# Patient Record
Sex: Female | Born: 1960 | Race: White | Hispanic: No | Marital: Married | Smoking: Current every day smoker
Health system: Southern US, Community
[De-identification: ages and names within clinical notes are randomized; demographics above are authoritative.]

## PROBLEM LIST (undated history)

## (undated) DIAGNOSIS — I10 Essential (primary) hypertension: Secondary | ICD-10-CM

## (undated) HISTORY — PX: TUMOR REMOVAL: SHX12

---

## 2007-06-14 ENCOUNTER — Ambulatory Visit: Payer: Self-pay | Admitting: Gastroenterology

## 2007-07-09 ENCOUNTER — Ambulatory Visit: Payer: Self-pay | Admitting: Gastroenterology

## 2007-07-31 ENCOUNTER — Emergency Department: Payer: Self-pay | Admitting: Emergency Medicine

## 2007-08-15 ENCOUNTER — Ambulatory Visit: Payer: Self-pay | Admitting: Gastroenterology

## 2007-08-19 ENCOUNTER — Ambulatory Visit: Payer: Self-pay | Admitting: Gastroenterology

## 2007-08-23 ENCOUNTER — Other Ambulatory Visit: Payer: Self-pay

## 2007-08-23 ENCOUNTER — Emergency Department: Payer: Self-pay | Admitting: Emergency Medicine

## 2007-11-12 ENCOUNTER — Ambulatory Visit: Payer: Self-pay | Admitting: Gastroenterology

## 2008-09-22 ENCOUNTER — Ambulatory Visit: Payer: Self-pay | Admitting: Obstetrics and Gynecology

## 2008-10-07 ENCOUNTER — Ambulatory Visit: Payer: Self-pay | Admitting: Obstetrics and Gynecology

## 2015-09-20 ENCOUNTER — Other Ambulatory Visit: Payer: Self-pay | Admitting: Gastroenterology

## 2015-09-20 DIAGNOSIS — R1084 Generalized abdominal pain: Secondary | ICD-10-CM

## 2015-09-23 ENCOUNTER — Ambulatory Visit
Admission: RE | Admit: 2015-09-23 | Discharge: 2015-09-23 | Disposition: A | Discharge status: Home / Self Care | Payer: 59 | Source: Ambulatory Visit | Attending: Gastroenterology | Admitting: Gastroenterology

## 2015-09-23 DIAGNOSIS — R1084 Generalized abdominal pain: Secondary | ICD-10-CM | POA: Insufficient documentation

## 2015-09-23 DIAGNOSIS — K760 Fatty (change of) liver, not elsewhere classified: Secondary | ICD-10-CM | POA: Diagnosis not present

## 2015-09-23 DIAGNOSIS — I700 Atherosclerosis of aorta: Secondary | ICD-10-CM | POA: Diagnosis not present

## 2015-09-23 HISTORY — DX: Essential (primary) hypertension: I10

## 2015-09-23 MED ORDER — IOHEXOL 300 MG/ML  SOLN
100.0000 mL | Freq: Once | INTRAMUSCULAR | Status: AC | PRN
  Administered 2015-09-23: 100 mL via INTRAVENOUS

## 2015-09-24 ENCOUNTER — Other Ambulatory Visit: Payer: Self-pay | Admitting: Gastroenterology

## 2015-09-24 DIAGNOSIS — R1011 Right upper quadrant pain: Secondary | ICD-10-CM

## 2015-09-27 ENCOUNTER — Telehealth (HOSPITAL_COMMUNITY): Payer: Self-pay | Admitting: Gastroenterology

## 2015-10-05 ENCOUNTER — Encounter
Admission: RE | Admit: 2015-10-05 | Discharge: 2015-10-05 | Disposition: A | Discharge status: Home / Self Care | Payer: 59 | Source: Ambulatory Visit | Attending: Gastroenterology | Admitting: Gastroenterology

## 2015-10-05 DIAGNOSIS — R1011 Right upper quadrant pain: Secondary | ICD-10-CM | POA: Insufficient documentation

## 2015-10-05 DIAGNOSIS — R110 Nausea: Secondary | ICD-10-CM | POA: Insufficient documentation

## 2015-10-05 MED ORDER — TECHNETIUM TC 99M MEBROFENIN IV KIT
5.0000 | PACK | Freq: Once | INTRAVENOUS | Status: AC | PRN
  Administered 2015-10-05: 5.15 via INTRAVENOUS

## 2015-10-05 MED ORDER — SINCALIDE 5 MCG IJ SOLR
0.0200 ug/kg | Freq: Once | INTRAMUSCULAR | Status: AC
  Administered 2015-10-05: 1.37 ug via INTRAVENOUS

## 2016-09-20 IMAGING — NM NM HEPATO W/GB/PHARM/[PERSON_NAME]
3 series · 18 of 18 positions shown · non-contrast
Comparison: None.

CLINICAL DATA: Right upper quadrant abdominal pain and nausea.

EXAM:
NUCLEAR MEDICINE HEPATOBILIARY IMAGING WITH GALLBLADDER EF
TECHNIQUE: Sequential images of the abdomen were obtained [DATE] minutes
following intravenous administration of radiopharmaceutical. After
slow intravenous infusion of 1.4 micrograms Cholecystokinin,
gallbladder ejection fraction was determined.
RADIOPHARMACEUTICALS:  5.2 mCi Qc-FFm Choletec IV

[Series 1000: hepatobiliary dynamic · 9.59mm/px · 6 of 60 frames shown]
[frame 6/60]
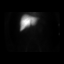
[frame 16/60]
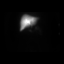
[frame 26/60]
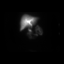
[frame 36/60]
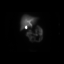
[frame 46/60]
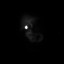
[frame 56/60]
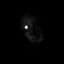

[Series 1000: gallbladder ef dynamic (results) · 4.80mm/px · 6 of 120 frames shown]
[frame 11/120]
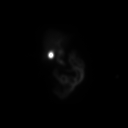
[frame 31/120]
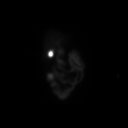
[frame 51/120]
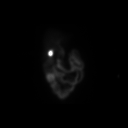
[frame 71/120]
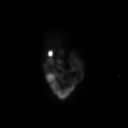
[frame 91/120]
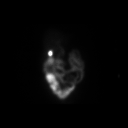
[frame 111/120]
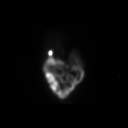

[Series 1000: gallbladder ef dynamic · 4.80mm/px · 6 of 120 frames shown]
[frame 11/120]
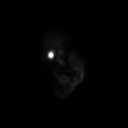
[frame 31/120]
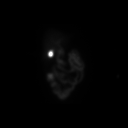
[frame 51/120]
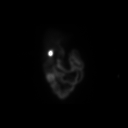
[frame 71/120]
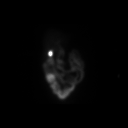
[frame 91/120]
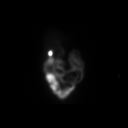
[frame 111/120]
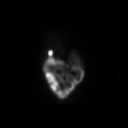

[18 of 18 positions shown; findings below may reference images not displayed]

FINDINGS: Radiopharmaceutical uptake by liver and biliary excretion of
activity demonstrated. Liver is unremarkable in appearance.

Gallbladder filling is demonstrated, consistent with patent cystic
duct. Biliary activity also seen entering small bowel, consistent
with patent common bile duct.

Calculated gallbladder ejection fraction is 72%. At 45 min, normal
ejection fraction is greater than 40%.
IMPRESSION: Normal study. Patency of cystic and common bile ducts demonstrated.

Normal gallbladder ejection fraction of 72% .

## 2018-09-18 ENCOUNTER — Emergency Department (HOSPITAL_COMMUNITY): Admit: 2018-09-18 | Discharge: 2018-09-18 | Discharge status: Absent without leave | Payer: 59

## 2022-01-12 ENCOUNTER — Other Ambulatory Visit: Payer: Self-pay

## 2022-01-12 ENCOUNTER — Other Ambulatory Visit
Admission: RE | Admit: 2022-01-12 | Discharge: 2022-01-12 | Disposition: A | Discharge status: Home / Self Care | Payer: 59 | Source: Home / Self Care | Attending: Ophthalmology | Admitting: Ophthalmology

## 2022-01-12 ENCOUNTER — Other Ambulatory Visit: Payer: Self-pay | Admitting: Ophthalmology

## 2022-01-12 ENCOUNTER — Ambulatory Visit
Admission: RE | Admit: 2022-01-12 | Discharge: 2022-01-12 | Disposition: A | Discharge status: Home / Self Care | Payer: 59 | Source: Ambulatory Visit | Attending: Ophthalmology | Admitting: Ophthalmology

## 2022-01-12 DIAGNOSIS — H05119 Granuloma of unspecified orbit: Secondary | ICD-10-CM | POA: Insufficient documentation

## 2022-01-12 LAB — CBC WITH DIFFERENTIAL/PLATELET
Abs Immature Granulocytes: 0.03 10*3/uL (ref 0.00–0.07)
Basophils Absolute: 0.1 10*3/uL (ref 0.0–0.1)
Basophils Relative: 1 %
Eosinophils Absolute: 0.3 10*3/uL (ref 0.0–0.5)
Eosinophils Relative: 3 %
HCT: 44.4 % (ref 36.0–46.0)
Hemoglobin: 14.7 g/dL (ref 12.0–15.0)
Immature Granulocytes: 0 %
Lymphocytes Relative: 28 %
Lymphs Abs: 2.8 10*3/uL (ref 0.7–4.0)
MCH: 28.5 pg (ref 26.0–34.0)
MCHC: 33.1 g/dL (ref 30.0–36.0)
MCV: 86 fL (ref 80.0–100.0)
Monocytes Absolute: 0.7 10*3/uL (ref 0.1–1.0)
Monocytes Relative: 7 %
Neutro Abs: 6.3 10*3/uL (ref 1.7–7.7)
Neutrophils Relative %: 61 %
Platelets: 246 10*3/uL (ref 150–400)
RBC: 5.16 MIL/uL — ABNORMAL HIGH (ref 3.87–5.11)
RDW: 13.4 % (ref 11.5–15.5)
WBC: 10 10*3/uL (ref 4.0–10.5)
nRBC: 0 % (ref 0.0–0.2)

## 2022-01-12 LAB — RENAL FUNCTION PANEL
Albumin: 4.3 g/dL (ref 3.5–5.0)
Anion gap: 10 (ref 5–15)
BUN: 18 mg/dL (ref 8–23)
CO2: 28 mmol/L (ref 22–32)
Calcium: 10.1 mg/dL (ref 8.9–10.3)
Chloride: 99 mmol/L (ref 98–111)
Creatinine, Ser: 1.35 mg/dL — ABNORMAL HIGH (ref 0.44–1.00)
GFR, Estimated: 44 mL/min — ABNORMAL LOW (ref >60)
Glucose, Bld: 105 mg/dL — ABNORMAL HIGH (ref 70–99)
Phosphorus: 3.2 mg/dL (ref 2.5–4.6)
Potassium: 4 mmol/L (ref 3.5–5.1)
Sodium: 137 mmol/L (ref 135–145)

## 2022-01-12 LAB — C-REACTIVE PROTEIN: CRP: 2.7 mg/dL — ABNORMAL HIGH (ref <1.0)

## 2022-01-12 LAB — GLUCOSE, RANDOM: Glucose, Bld: 101 mg/dL — ABNORMAL HIGH (ref 70–99)

## 2022-01-12 LAB — LACTATE DEHYDROGENASE: LDH: 159 U/L (ref 98–192)

## 2022-01-12 LAB — SEDIMENTATION RATE: Sed Rate: 17 mm/hr (ref 0–30)

## 2022-01-12 LAB — POCT I-STAT CREATININE: Creatinine, Ser: 1.4 mg/dL — ABNORMAL HIGH (ref 0.44–1.00)

## 2022-01-12 MED ORDER — IOHEXOL 300 MG/ML  SOLN
75.0000 mL | Freq: Once | INTRAMUSCULAR | Status: AC | PRN
  Administered 2022-01-12: 14:00:00 60 mL via INTRAVENOUS

## 2022-01-13 LAB — IGG 1, 2, 3, AND 4
IgG (Immunoglobin G), Serum: 866 mg/dL (ref 586–1602)
IgG, Subclass 1: 447 mg/dL (ref 248–810)
IgG, Subclass 2: 279 mg/dL (ref 130–555)
IgG, Subclass 3: 62 mg/dL (ref 15–102)
IgG, Subclass 4: 45 mg/dL (ref 2–96)

## 2022-01-13 LAB — HEMOGLOBIN A1C
Hgb A1c MFr Bld: 5.7 % — ABNORMAL HIGH (ref 4.8–5.6)
Mean Plasma Glucose: 117 mg/dL

## 2022-01-13 LAB — ANGIOTENSIN CONVERTING ENZYME: Angiotensin-Converting Enzyme: 56 U/L (ref 14–82)

## 2022-01-13 LAB — ANA: Anti Nuclear Antibody (ANA): NEGATIVE

## 2022-01-16 LAB — PROTEIN ELECTROPHORESIS, SERUM
A/G Ratio: 1.2 (ref 0.7–1.7)
Albumin ELP: 3.8 g/dL (ref 2.9–4.4)
Alpha-1-Globulin: 0.3 g/dL (ref 0.0–0.4)
Alpha-2-Globulin: 0.9 g/dL (ref 0.4–1.0)
Beta Globulin: 1 g/dL (ref 0.7–1.3)
Gamma Globulin: 0.9 g/dL (ref 0.4–1.8)
Globulin, Total: 3.1 g/dL (ref 2.2–3.9)
Total Protein ELP: 6.9 g/dL (ref 6.0–8.5)

## 2022-01-16 LAB — ANCA TITERS
Atypical P-ANCA titer: <1:20 {titer}
C-ANCA: <1:20 {titer}
P-ANCA: <1:20 {titer}

## 2022-01-16 LAB — LYSOZYME, SERUM: Lysozyme: 11.1 ug/mL — ABNORMAL HIGH (ref 3.6–8.1)

## 2022-05-18 ENCOUNTER — Ambulatory Visit
Admission: EM | Admit: 2022-05-18 | Discharge: 2022-05-18 | Disposition: A | Discharge status: Home / Self Care | Payer: 59 | Attending: Emergency Medicine | Admitting: Emergency Medicine

## 2022-05-18 DIAGNOSIS — R519 Headache, unspecified: Secondary | ICD-10-CM

## 2022-05-18 DIAGNOSIS — B3731 Acute candidiasis of vulva and vagina: Secondary | ICD-10-CM | POA: Insufficient documentation

## 2022-05-18 LAB — URINALYSIS, ROUTINE W REFLEX MICROSCOPIC
Bilirubin Urine: NEGATIVE
Glucose, UA: NEGATIVE mg/dL
Ketones, ur: NEGATIVE mg/dL
Nitrite: NEGATIVE
Protein, ur: NEGATIVE mg/dL
Specific Gravity, Urine: <1.005 — ABNORMAL LOW (ref 1.005–1.030)
pH: 5.5 (ref 5.0–8.0)

## 2022-05-18 LAB — URINALYSIS, MICROSCOPIC (REFLEX)

## 2022-05-18 LAB — WET PREP, GENITAL
Clue Cells Wet Prep HPF POC: NONE SEEN
Sperm: NONE SEEN
Trich, Wet Prep: NONE SEEN
WBC, Wet Prep HPF POC: >10 — AB (ref <10)

## 2022-05-18 MED ORDER — IBUPROFEN 800 MG PO TABS: 800.0000 mg | ORAL_TABLET | Freq: Three times a day (TID) | ORAL | 0 refills | Status: AC

## 2022-05-18 MED ORDER — DEXAMETHASONE SODIUM PHOSPHATE 10 MG/ML IJ SOLN
10.0000 mg | Freq: Once | INTRAMUSCULAR | Status: AC
  Administered 2022-05-18: 10 mg via INTRAMUSCULAR

## 2022-05-18 MED ORDER — FLUCONAZOLE 150 MG PO TABS: 150.0000 mg | ORAL_TABLET | Freq: Every day | ORAL | 0 refills | Status: AC

## 2022-05-18 MED ORDER — ONDANSETRON 4 MG PO TBDP
4.0000 mg | ORAL_TABLET | Freq: Three times a day (TID) | ORAL | 0 refills | Status: AC | PRN
Start: 2022-05-18 — End: ?

## 2022-05-18 MED ORDER — KETOROLAC TROMETHAMINE 60 MG/2ML IM SOLN
30.0000 mg | Freq: Once | INTRAMUSCULAR | Status: AC
  Administered 2022-05-18: 30 mg via INTRAMUSCULAR

## 2022-05-18 NOTE — ED Provider Notes (Signed)
With distal [facility]    CSN: [id number] Arrival date & time: 05/18/22  1640      History   Chief Complaint Chief Complaint  Patient presents with   Headache   Nausea    HPI Felicia Austin is a 61 y.o. female.   Patient presents with centralized lower abdominal pain with  Nausea without vomiting for 1 day.  Pain does not radiate, described as a 10 out of 10, pain is constant and described as a restlessness . endorses that she had 3 soft but not watery stools this morning.  Decreased appetite but tolerating fluids.  No known sick contacts.  Denies fever, chills, body aches, upper respiratory symptoms, urinary or vaginal symptoms.  Endorses recent history of reoccurring urinary infection, last occurrence 2 to 3 weeks ago.    Patient presents with a frontal headache occurring constantly for the last 5 to 6 hours.  Headaches have been reoccurring for the last 3 to 4 days.  Associated photophobia, phonophobia and nausea worsen once headache was present.  Feels this may be related to her sinuses but denies nasal congestion.  Endorses ear pain beginning 1 day ago bilaterally without drainage, itching.  Has attempted use of over-the-counter analgesics which have been ineffective.  Denies visual changes, dizziness, lightheadedness, weakness, memory speech changes.   Past Medical History:  Diagnosis Date   Hypertension     There are no problems to display for this patient.   Past Surgical History:  Procedure Laterality Date   TUMOR REMOVAL     On Parathyroid gland    OB History   No obstetric history on file.      Home Medications    Prior to Admission medications   Medication Sig Start Date End Date Taking? Authorizing Provider  aspirin EC 81 MG tablet Take by mouth.   Yes [provider]  Calcium Citrate-Vitamin D 315-5 MG-MCG TABS daily. 02/28/10  Yes [provider]  diazepam (VALIUM) 10 MG tablet Take 10-20 mg by mouth daily as needed.  02/27/22  Yes [provider]  FLUoxetine (PROZAC) 20 MG capsule fluoxetine 20 mg capsule   Yes [provider]  levothyroxine (SYNTHROID) 50 MCG tablet Take 50 mcg by mouth daily. 03/30/22  Yes [provider]  lovastatin (MEVACOR) 20 MG tablet Take 1 tablet by mouth at bedtime.   Yes [provider]  nebivolol (BYSTOLIC) 10 MG tablet Take 10 mg by mouth daily. 05/17/22  Yes [provider]  promethazine (PHENERGAN) 25 MG tablet Take 1 tablet 3 times a day by oral route as needed for 15 days. 01/06/15  Yes [provider]    Family History History reviewed. No pertinent family history.  Social History Social History   Tobacco Use   Smoking status: Every Day    Types: Cigarettes   Smokeless tobacco: Never  Vaping Use   Vaping Use: Never used  Substance Use Topics   Alcohol use: Never   Drug use: Never     Allergies   Biaxin [clarithromycin], Codeine, and Sulfa antibiotics   Review of Systems Review of Systems  Gastrointestinal:  Positive for abdominal pain and nausea. Negative for abdominal distention, anal bleeding, blood in stool, constipation, diarrhea, rectal pain and vomiting.  Genitourinary: Negative.   Musculoskeletal: Negative.   Skin: Negative.   Neurological:  Positive for headaches. Negative for dizziness, tremors, seizures, syncope, facial asymmetry, speech difficulty, weakness, light-headedness and numbness.     Physical Exam Triage Vital  Signs ED Triage Vitals  Enc Vitals Group     BP 05/18/22 1734 (!) 149/90     Pulse Rate 05/18/22 1734 (!) 59     Resp 05/18/22 1734 18     Temp 05/18/22 1734 98.5 F (36.9 C)     Temp Source 05/18/22 1734 Oral     SpO2 05/18/22 1734 99 %     Weight 05/18/22 1727 144 lb (65.3 kg)     Height 05/18/22 1727 5\' 2"  (1.575 m)     Head Circumference --      Peak Flow --      Pain Score 05/18/22 1727 10     Pain Loc --      Pain Edu? --      Excl. in [place]? --    No data  found.  Updated Vital Signs BP (!) 149/90 (BP Location: Left Arm)   Pulse (!) 59   Temp 98.5 F (36.9 C) (Oral)   Resp 18   Ht 5\' 2"  (1.575 m)   Wt 144 lb (65.3 kg)   SpO2 99%   BMI 26.34 kg/m   Visual Acuity Right Eye Distance:   Left Eye Distance:   Bilateral Distance:    Right Eye Near:   Left Eye Near:    Bilateral Near:     Physical Exam Constitutional:      Appearance: Normal appearance. She is well-developed.  HENT:     Head: Normocephalic.     Right Ear: Tympanic membrane, ear canal and external ear normal.     Left Ear: Tympanic membrane, ear canal and external ear normal.     Nose: Nose normal.     Mouth/Throat:     Mouth: Mucous membranes are moist.     Pharynx: Oropharynx is clear.  Eyes:     Extraocular Movements: Extraocular movements intact.     Conjunctiva/sclera: Conjunctivae normal.     Pupils: Pupils are equal, round, and reactive to light.  Pulmonary:     Effort: Pulmonary effort is normal.  Abdominal:     General: Abdomen is flat. Bowel sounds are normal. There is no distension.     Palpations: Abdomen is soft.     Tenderness: There is abdominal tenderness in the suprapubic area. There is no right CVA tenderness, left CVA tenderness or guarding.  Skin:    General: Skin is warm and dry.  Neurological:     General: No focal deficit present.     Mental Status: She is alert and oriented to person, place, and time. Mental status is at baseline.  Psychiatric:        Mood and Affect: Mood normal.        Behavior: Behavior normal.      UC Treatments / Results  Labs (all labs ordered are listed, but only abnormal results are displayed) Labs Reviewed  URINALYSIS, ROUTINE W REFLEX MICROSCOPIC - Abnormal; Notable for the following components:      Result Value   Specific Gravity, Urine <1.005 (*)    Hgb urine dipstick TRACE (*)    Leukocytes,Ua SMALL (*)    All other components within normal limits  URINALYSIS, MICROSCOPIC (REFLEX) -  Abnormal; Notable for the following components:   Bacteria, UA FEW (*)    All other components within normal limits    EKG   Radiology No results found.  Procedures Procedures (including critical care time)  Medications Ordered in UC Medications - No data to display  Initial Impression /  Assessment and Plan / UC Course  I have reviewed the triage vital signs and the nursing notes.  Pertinent labs & imaging results that were available during my care of the patient were reviewed by me and considered in my medical decision making (see chart for details).  Yeast vaginitis Bad headache  Alysis shows normal leukocytes, few bacteria, negative for nitrates, sent for culture, will defer antibiotics at this time until culture results as patient has had reoccurring symptoms and frequent antibiotic use recently, wet prep is positive for yeast, negative for trichomoniasis and BV, discussed findings with patient, Diflucan sent to pharmacy and discussed administration, may use over-the-counter Pyridium as well as over-the-counter analgesics for management of discomfort, recommend increase fluid intake and good hygiene for additional supportive care, may follow-up with urgent care as needed symptoms persist or worsen  No abnormalities are noted neurologically or to the sinuses therefore we will move forward with treatment of headache, Toradol injection given on reevaluation no improvement seen, Decadron injection given shortly after, prescribed ibuprofen 800 mg, may use Tylenol in addition as well ensuring adequate rest and increase fluid intake to prevent exacerbating symptoms, as patient endorses her sinuses feel dry, discussed supportive measures, may follow-up with urgent care as needed if symptoms persist or worsen Final Clinical Impressions(s) / UC Diagnoses   Final diagnoses:  None   Discharge Instructions   None    ED Prescriptions   None    PDMP not reviewed this encounter.    Valinda Hoar, [place] 05/18/22 2027

## 2022-05-18 NOTE — Discharge Instructions (Signed)
I believe your stomach pain today and nausea is related to yeast infection and your headache is a different issue that just happens to be occurring at the same time  Today you are being treated prophylactically for yeast.   Take diflucan 150 mg once, if symptoms still present in 3 days then you may take second pill   Yeast infections which are caused by a naturally occurring fungus called candida. Vaginosis is an inflammation of the vagina that can result in discharge, itching and pain. The cause is usually a change in the normal balance of vaginal bacteria or an infection. Vaginosis can also result from reduced estrogen levels after menopause.  Your urinalysis showed a very small amount of Shahiem Bedwell blood cells and a few bacteria it has been sent to the lab to determine if bacteria will grow, if this occurs you will be notified and antibiotics sent hand, I would like to hold off on starting an antibiotic at this time to ensure that we are treating the correct infection  You may use over-the-counter Pyridium which can be found at most pharmacies to help minimize your symptoms until the fungal medicine or antibiotic is needed to help intervene, Pyridium will turn your urine a bright neon orange, this is normal in your urine which will return yellow once stopped  You have been given an injection of Toradol and Decadron here in the office to help minimize your headache, you have been prescribed ibuprofen 800 mg that you may use with Tylenol 500 to 1000 mg every 6 hours for management at home  Sure that you are getting adequate rest and adequate hydration as lack of either will make your headache worse  As you feel dry in your sinuses stop Mucinex, pseudoephedrine or antihistamines as these will cause dryness, you may moisten your nose with a petroleum like substance using a Q-tip, you may use a humidifier at bedtime, if you do not have 1 you may sit in a steamed bathroom to help moisten your airways and  ensure that your bedroom is not dry or stuffy at nighttime as this may worsen your symptoms  In addition:   Avoid baths, hot tubs and whirlpool spas.  Don't use scented or harsh soaps, such as those with deodorant or antibacterial action. Avoid irritants. These include scented tampons and pads. Wipe from front to back after using the toilet.  Don't douche. Your vagina doesn't require cleansing other than normal bathing.  Use a  condom. Wear cotton underwear, this fabric helps absorb moisture

## 2022-05-18 NOTE — ED Triage Notes (Signed)
Pt c/o abdominal pain and headache.  Pt states that she is nauseated and wants to vomit.   Pt recently finished an antibiotic.  Pt states she has a history of UTIs after finishing antibiotics.   Pt denies vaginal discharge or odor.

## 2022-05-19 LAB — URINE CULTURE: Culture: <10000 — AB

## 2022-05-24 ENCOUNTER — Other Ambulatory Visit: Payer: Self-pay

## 2022-05-24 ENCOUNTER — Ambulatory Visit
Admission: EM | Admit: 2022-05-24 | Discharge: 2022-05-24 | Disposition: A | Discharge status: Home / Self Care | Payer: 59 | Attending: Emergency Medicine | Admitting: Emergency Medicine

## 2022-05-24 ENCOUNTER — Encounter: Payer: Self-pay | Admitting: Emergency Medicine

## 2022-05-24 DIAGNOSIS — I10 Essential (primary) hypertension: Secondary | ICD-10-CM

## 2022-05-24 DIAGNOSIS — R110 Nausea: Secondary | ICD-10-CM

## 2022-05-24 DIAGNOSIS — R001 Bradycardia, unspecified: Secondary | ICD-10-CM

## 2022-05-24 DIAGNOSIS — R519 Headache, unspecified: Secondary | ICD-10-CM

## 2022-05-24 NOTE — Discharge Instructions (Addendum)
Please go to the emergency department [facility] for evaluation of your headache, nausea, and slow heart rhythm.  Please go now.  If you develop any chest pain or worsening symptoms he is pull over and call 911.

## 2022-05-24 NOTE — ED Notes (Signed)
Patient is being discharged from the Urgent Care and sent to the Emergency Department via POV . Per Riki Rusk Ryan,NP, patient is in need of higher level of care due to bradycardia, headache, nausea. Patient is aware and verbalizes understanding of plan of care. Pt refused EMS transport. Vitals:   05/24/22 1009  BP: (!) 207/99  Pulse: (!) 45  Resp: 18  Temp: 98.2 F (36.8 C)  SpO2: 100%

## 2022-05-24 NOTE — ED Provider Notes (Signed)
[facility]    CSN: [id number] Arrival date & time: 05/24/22  4481      History   Chief Complaint Chief Complaint  Patient presents with   Nausea   Hypertension   Bradycardia    HPI Felicia Austin is a 61 y.o. female.   HPI  34 old female here for evaluation of neurologic complaint.  Patient reports that she developed a frontal headache that is 10 out of 10 and she describes as the worst headache of her life at approximately 930.  This was associated with sweating.  The headache was preceded by nausea.  She checked her BP at work and it was 150-160/90-100.  She denies any dizziness, changes in vision, vomiting, chest pain, or shortness of breath.  She does have a history of migraines.  Blood pressure here is 207/99 and she is bradycardic at 45 bpm.  She is also breathing with Kusmal respirations.  She does take Bystolic for blood pressure at night and she states that she only took 1 tablet last night.  Patient has no history of diabetes.  Past Medical History:  Diagnosis Date   Hypertension     There are no problems to display for this patient.   Past Surgical History:  Procedure Laterality Date   TUMOR REMOVAL     On Parathyroid gland    OB History   No obstetric history on file.      Home Medications    Prior to Admission medications   Medication Sig Start Date End Date Taking? Authorizing Provider  aspirin EC 81 MG tablet Take by mouth.   Yes [provider]  Calcium Citrate-Vitamin D 315-5 MG-MCG TABS daily. 02/28/10  Yes [provider]  diazepam (VALIUM) 10 MG tablet Take 10-20 mg by mouth daily as needed. 02/27/22  Yes [provider]  FLUoxetine (PROZAC) 20 MG capsule fluoxetine 20 mg capsule   Yes [provider]  levothyroxine (SYNTHROID) 50 MCG tablet Take 50 mcg by mouth daily. 03/30/22  Yes [provider]  lovastatin (MEVACOR) 20 MG tablet Take 1 tablet by mouth at bedtime.   Yes [provider]  nebivolol (BYSTOLIC) 10 MG tablet Take 10 mg by mouth daily. 05/17/22  Yes [provider]  ibuprofen (ADVIL) 800 MG tablet Take 1 tablet (800 mg total) by mouth 3 (three) times daily. 05/18/22   White, Elita Boone, NP  ondansetron (ZOFRAN-ODT) 4 MG disintegrating tablet Take 1 tablet (4 mg total) by mouth every 8 (eight) hours as needed for nausea or vomiting. 05/18/22   Valinda Hoar, NP  promethazine (PHENERGAN) 25 MG tablet Take 1 tablet 3 times a day by oral route as needed for 15 days. 01/06/15   [provider]    Family History No family history on file.  Social History Social History   Tobacco Use   Smoking status: Every Day    Types: Cigarettes   Smokeless tobacco: Never  Vaping Use   Vaping Use: Never used  Substance Use Topics   Alcohol use: Never   Drug use: Never     Allergies   Biaxin [clarithromycin], Codeine, and Sulfa antibiotics   Review of Systems Review of Systems  Constitutional:  Positive for diaphoresis.  Eyes:  Negative for visual disturbance.  Respiratory:  Negative for shortness of breath.   Cardiovascular:  Negative for chest pain.  Gastrointestinal:  Positive for nausea. Negative for vomiting.  Neurological:  Positive for dizziness and headaches. Negative  for weakness.  Hematological: Negative.      Physical Exam Triage Vital Signs ED Triage Vitals  Enc Vitals Group     BP 05/24/22 1009 (!) 207/99     Pulse Rate 05/24/22 1009 (!) 45     Resp 05/24/22 1009 18     Temp 05/24/22 1009 98.2 F (36.8 C)     Temp Source 05/24/22 1009 Oral     SpO2 05/24/22 1009 100 %     Weight 05/24/22 1006 143 lb 15.4 oz (65.3 kg)     Height 05/24/22 1006 5\' 2"  (1.575 m)     Head Circumference --      Peak Flow --      Pain Score 05/24/22 1005 10     Pain Loc --      Pain Edu? --      Excl. in [place]? --    No data found.  Updated Vital Signs BP (!) 207/99 (BP Location: Left Arm)   Pulse (!) 45   Temp 98.2 F (36.8  C) (Oral)   Resp 18   Ht 5\' 2"  (1.575 m)   Wt 143 lb 15.4 oz (65.3 kg)   SpO2 100%   BMI 26.33 kg/m   Visual Acuity Right Eye Distance:   Left Eye Distance:   Bilateral Distance:    Right Eye Near:   Left Eye Near:    Bilateral Near:     Physical Exam Vitals and nursing note reviewed.  Constitutional:      General: She is in acute distress.     Appearance: She is ill-appearing.  HENT:     Head: Normocephalic and atraumatic.  Eyes:     General: No scleral icterus.    Extraocular Movements: Extraocular movements intact.     Conjunctiva/sclera: Conjunctivae normal.     Pupils: Pupils are equal, round, and reactive to light.  Cardiovascular:     Rate and Rhythm: Regular rhythm. Bradycardia present.     Pulses: Normal pulses.     Heart sounds: Normal heart sounds. No murmur heard.    No friction rub. No gallop.  Pulmonary:     Effort: Pulmonary effort is normal.     Breath sounds: Normal breath sounds. No wheezing, rhonchi or rales.  Skin:    General: Skin is warm and dry.     Capillary Refill: Capillary refill takes less than 2 seconds.     Findings: Bruising present. No erythema or rash.  Neurological:     General: No focal deficit present.     Mental Status: She is alert and oriented to person, place, and time.  Psychiatric:        Mood and Affect: Mood normal.        Behavior: Behavior normal.        Thought Content: Thought content normal.        Judgment: Judgment normal.      UC Treatments / Results  Labs (all labs ordered are listed, but only abnormal results are displayed) Labs Reviewed - No data to display  EKG Marked sinus bradycardia with a ventricular rate of 43 bpm Parable 144 ms QRS duration 80 ms QT/QTc 542/457 ms Machine reading ST abnormality with possible digitalis effect.     Radiology No results found.  Procedures Procedures (including critical care time)  Medications Ordered in UC Medications - No data to display  Initial  Impression / Assessment and Plan / UC Course  I have reviewed the triage vital signs  and the nursing notes.  Pertinent labs & imaging results that were available during my care of the patient were reviewed by me and considered in my medical decision making (see chart for details).  Patient is a very ill-appearing 61 year old female presenting for 10/10 frontal headache that she describes as the worst of her life and started approximately an hour ago.  This was preceded by nausea.  She also Dors is that she has had some sweating episodes.  She denies any vomiting, change in vision, chest pain, or shortness of breath.  On exam patient has a deep sighing respirations but no history of diabetes.  No ketones appreciated on her breath.  Cranial nerves II through XII are intact.  Pupils are equal round and reactive.  Cardiopulmonary exam reveals S1-S2 heart sounds but they sound distant.  Lungs are clear to auscultation all fields.  Patient's bilateral grips are 5/5.  Patient's EKG shows marked sinus bradycardia with a ventricular rate of 43 bpm.  EKG is interpreting it as ST abnormality with possible digitalis effect.  Patient does not take digitalis.  She takes [employer] development [employer] and states that she only took 1 tablet there is no possibility of her taking more than that.  When compared to 08/23/2007 there is some flattening of the ST segment in V1 through V6.  Patient is ill-appearing and I am concerned given the slow heart rate on her EKG as well as the 10/10 frontal headache that she is describing is the worst headache of her life.  I feel she needs to be evaluated in the ER and needs imaging of her head which we cannot perform here at the urgent care.  I have suggested, and offered, that she go by EMS to the hospital.  Patient declines and states that she will have her husband take her to [facility].  Patient left ambulatory, stable, and will travel via POV.  I did have patient's sign out AMA since she did not want  to take EMS prior to her leaving.   Final Clinical Impressions(s) / UC Diagnoses   Final diagnoses:  Worst headache of life  Nausea  Primary hypertension  Bradycardia by electrocardiogram     Discharge Instructions      Please go to the emergency department [facility] for evaluation of your headache, nausea, and slow heart rhythm.  Please go now.  If you develop any chest pain or worsening symptoms he is pull over and call 911.     ED Prescriptions   None    PDMP not reviewed this encounter.   Becky August[place], NP 05/24/22 1054

## 2022-05-24 NOTE — ED Notes (Signed)
Went back into the exam room to perform EKG and pt states she got dizzy when laying back. Pt is also tearful at this time.

## 2022-05-24 NOTE — ED Notes (Signed)
Pt refused EMS transport, and signed physical AMA form. Husband will take her to [facility] [place].

## 2022-05-24 NOTE — ED Triage Notes (Signed)
Pt c/o nausea and headache. Started this morning. She states her BP was elevated at work 150-160/90's-100's. Denies dizziness, shortness of breath or chest pain. No h/o migraines.
